# Patient Record
Sex: Male | Born: 1985 | Race: White | Hispanic: No | Marital: Married
Health system: Midwestern US, Academic
[De-identification: ages and names within clinical notes are randomized; demographics above are authoritative.]

---

## 2020-08-26 ENCOUNTER — Encounter: Admit: 2020-08-26 | Discharge: 2020-08-26

## 2020-12-29 ENCOUNTER — Encounter: Admit: 2020-12-29 | Discharge: 2020-12-29 | Payer: Commercial Managed Care - PPO

## 2021-01-02 ENCOUNTER — Encounter: Admit: 2021-01-02 | Discharge: 2021-01-02 | Payer: Commercial Managed Care - PPO

## 2021-01-02 DIAGNOSIS — F419 Anxiety disorder, unspecified: Secondary | ICD-10-CM

## 2021-01-02 DIAGNOSIS — K219 Gastro-esophageal reflux disease without esophagitis: Secondary | ICD-10-CM

## 2021-01-02 DIAGNOSIS — I10 Essential (primary) hypertension: Secondary | ICD-10-CM

## 2021-01-02 DIAGNOSIS — M109 Gout, unspecified: Secondary | ICD-10-CM

## 2021-01-04 ENCOUNTER — Encounter: Admit: 2021-01-04 | Discharge: 2021-01-04 | Payer: Commercial Managed Care - PPO

## 2021-01-05 ENCOUNTER — Encounter: Admit: 2021-01-05 | Discharge: 2021-01-05 | Payer: Commercial Managed Care - PPO

## 2021-01-05 DIAGNOSIS — F419 Anxiety disorder, unspecified: Secondary | ICD-10-CM

## 2021-01-05 DIAGNOSIS — I10 Essential (primary) hypertension: Secondary | ICD-10-CM

## 2021-01-05 DIAGNOSIS — E6609 Other obesity due to excess calories: Secondary | ICD-10-CM

## 2021-01-05 DIAGNOSIS — Z8249 Family history of ischemic heart disease and other diseases of the circulatory system: Secondary | ICD-10-CM

## 2021-01-05 DIAGNOSIS — E785 Hyperlipidemia, unspecified: Secondary | ICD-10-CM

## 2021-01-05 DIAGNOSIS — K219 Gastro-esophageal reflux disease without esophagitis: Secondary | ICD-10-CM

## 2021-01-05 DIAGNOSIS — M109 Gout, unspecified: Secondary | ICD-10-CM

## 2021-01-05 DIAGNOSIS — Z23 Encounter for immunization: Secondary | ICD-10-CM

## 2021-01-05 DIAGNOSIS — R931 Abnormal findings on diagnostic imaging of heart and coronary circulation: Secondary | ICD-10-CM

## 2021-01-05 DIAGNOSIS — Z136 Encounter for screening for cardiovascular disorders: Secondary | ICD-10-CM

## 2021-01-05 NOTE — Progress Notes
Date of Service: 01/05/2021    Arthur Richard is a 35 y.o. male.       HPI      Arthur Richard is a 35 y.o. white  male with 2-3 years history of hypertension, obesity, current BMI 39.62 kg/m?, hyperlipidemia, family history of coronary artery disease, history of COVID-19 viral syndrome upper respiratory tract infection, and a recently performed calcium score dated 12/20/2020 it was 21, all of it located in the LAD.    Patient does not report having any symptoms of chest pain, no shortness of breath, no heart palpitations, no presyncope or syncope.    He has anxiety and he is very worried about the findings on the cardioscan.    He was evaluated for hyperlipidemia on 12/10/2020 and this demonstrated the following TC = 296 mg/dL, triglycerides = 454 mg/dL, HDL = 41 mg/dL, LDL = 098 mg/dL.  Patient was initiated on atorvastatin.    Past medical history: Hypertension, gout.    Family history: Patient's father did undergo CABG, mother has hyperlipidemia, has a brother with NASH.     Social history: Patient works as a Furniture conservator/restorer at [employer].  He does not smoke cigarettes and drinks alcohol occasionally         Vitals:    01/05/21 1317   BP: (!) 128/90   BP Source: Arm, Left Upper   Pulse: 98   SpO2: 98%   O2 Device: None (Room air)   PainSc: Zero   Weight: (!) 140 kg (308 lb 9.6 oz)   Height: 188 cm (6' 2)     Body mass index is 39.62 kg/m?Marland Kitchen     Past Medical History  Patient Active Problem List    Diagnosis Date Noted   ? Anxiety 01/02/2021   ? GERD (gastroesophageal reflux disease) 01/02/2021   ? Primary hypertension 01/02/2021   ? Gout 01/02/2021   ? Elevated coronary artery calcium score 01/02/2021     12/20/20 Coronary Calcium Score:  21  LAD            Review of Systems   Constitutional: Negative.   HENT: Negative.    Eyes: Negative.    Cardiovascular: Negative.    Respiratory: Negative.    Endocrine: Negative.    Hematologic/Lymphatic: Negative.    Skin: Negative.    Musculoskeletal: Negative. Gastrointestinal: Negative.    Genitourinary: Negative.    Neurological: Negative.    Psychiatric/Behavioral: Negative.    Allergic/Immunologic: Negative.        Physical Exam  General Appearance: obese  Skin: warm, moist, no ulcers or xanthomas  Eyes: conjunctivae and lids normal, pupils are equal and round  Lips & Oral Mucosa: no pallor or cyanosis  Neck Veins: neck veins are flat, neck veins are not distended  Chest Inspection: chest is normal in appearance  Respiratory Effort: breathing comfortably, no respiratory distress  Auscultation/Percussion: lungs clear to auscultation, no rales or rhonchi, no wheezing  Cardiac Rhythm: regular rhythm and normal rate  Cardiac Auscultation: S1, S2 normal, no rub, no gallop  Murmurs: no murmur  Carotid Arteries: normal carotid upstroke bilaterally, no bruit  Abdominal aorta: could not be examined due to obese adomen  Lower Extremity Edema: no lower extremity edema  Abdominal Exam: soft, non-tender, no masses, bowel sounds normal  Liver & Spleen: no organomegaly  Language and Memory: patient responsive and seems to comprehend information  Neurologic Exam: neurological assessment grossly intact      Cardiovascular Studies  Twelve-lead  EKG demonstrated normal sinus rhythm, no axis deviation, ventricular rate 90 bpm    Cardiovascular Health Factors  Vitals BP Readings from Last 3 Encounters:   01/05/21 (!) 128/90     Wt Readings from Last 3 Encounters:   01/05/21 (!) 140 kg (308 lb 9.6 oz)   04/22/09 131.1 kg (289 lb)     BMI Readings from Last 3 Encounters:   01/05/21 39.62 kg/m?   04/22/09 38.13 kg/m?      Smoking Social History     Tobacco Use   Smoking Status Never Smoker   Smokeless Tobacco Never Used      Lipid Profile No results found for: CHOL  No results found for: HDL  No results found for: LDL  No results found for: TRIG   Blood Sugar No results found for: HGBA1C  No results found for: GLU, GLUF, GLUPOC       Problems Addressed Today  Encounter Diagnoses   Name Primary?   ? Screening for heart disease Yes   ? COVID-19 vaccine administered    ? Elevated coronary artery calcium score    ? Primary hypertension    ? Hyperlipidemia, unspecified hyperlipidemia type    ? Class 2 obesity due to excess calories without serious comorbidity with body mass index (BMI) of 39.0 to 39.9 in adult    ? Anxiety    ? Gastroesophageal reflux disease, unspecified whether esophagitis present    ? Family history of coronary artery disease        Assessment and Plan     Impression:    1.  Abnormal calcium score- it was 21, all located in the LAD, study dated 12/20/2020  2.  Primary hypertension-currently on an ACE inhibitor  3.  Hyperlipidemia-significantly elevated total cholesterol and LDL, patient was initiated on atorvastatin  4.  Family history of coronary artery disease-patient's father did undergo CABG  5.  Obesity-patient's BMI is 39.62 kg/m?  6.  At risk for further progression of coronary artery disease based on the current rescore, family history, hyperlipidemia, hypertension, obesity and gender  7.  History of heart palpitations in the past-patient was evaluated with a Holter monitor, unremarkable.    Plan:    1.  Patient will be evaluated with the following:  ? Perfusion imaging study Bruce protocol  ? 2D echo Doppler study  2.  I highly recommended weight reduction, 10% of present weight over the next 6 months  ? I do recommend DASHdiet program  ? Also recommended weight watchers  3.  I recommended overall risk factors modifications, low-fat, low-cholesterol, low sugar, low carbohydrate and a strict low-sodium diet  4.  Repeat fasting lipid and liver profile in 3 months  5.  We will call the patient with the results of the echocardiogram and stress test and will continue to follow him up in the office      Total Time Today was 60 minutes in the following activities: Preparing to see the patient, Obtaining and/or reviewing separately obtained history, Performing a medically appropriate examination and/or evaluation, Counseling and educating the patient/family/caregiver, Ordering medications, tests, or procedures, Referring and communication with other health care professionals (when not separately reported), Documenting clinical information in the electronic or other health record, Independently interpreting results (not separately reported) and communicating results to the patient/family/caregiver and Care coordination (not separately reported)         Current Medications (including today's revisions)  ? allopurinoL (ZYLOPRIM) 300 mg tablet Take 300 mg by mouth  daily.   ? atorvastatin (LIPITOR) 20 mg tablet Take 20 mg by mouth daily.   ? clonazepam (KLONOPIN) 0.5 mg PO tablet Take 0.5 mg by mouth As Needed.   ? lisinopriL (ZESTRIL) 20 mg tablet Take 20 mg by mouth daily.   ? venlafaxine XR (EFFEXOR XR) 150 mg capsule Take 150 mg by mouth daily.

## 2021-06-12 ENCOUNTER — Encounter: Admit: 2021-06-12 | Discharge: 2021-06-12 | Payer: Commercial Managed Care - PPO

## 2021-06-26 ENCOUNTER — Encounter: Admit: 2021-06-26 | Discharge: 2021-06-26 | Payer: Commercial Managed Care - PPO

## 2021-06-26 NOTE — Telephone Encounter
Patient called nursing line stating that he doesn't want to complete stress test and echo at this time. He states that the testing is very expensive and he has been working on on dieting and exercise. Patient states that he would like to keep his follow up appointment with Dr. Avie ArenasHannen 08/01/21 to further discuss care plan. Patient verified D-T-L of appointment.

## 2021-07-26 ENCOUNTER — Encounter: Admit: 2021-07-26 | Discharge: 2021-07-26 | Payer: Commercial Managed Care - PPO

## 2021-07-26 DIAGNOSIS — E785 Hyperlipidemia, unspecified: Secondary | ICD-10-CM

## 2021-07-26 LAB — LIPID PROFILE
CHOLESTEROL/HDL %: 4
CHOLESTEROL: 165
HDL: 41
LDL: 95
TRIGLYCERIDES: 146
VLDL: 29

## 2021-08-01 ENCOUNTER — Encounter: Admit: 2021-08-01 | Discharge: 2021-08-01 | Payer: Commercial Managed Care - PPO

## 2021-08-01 ENCOUNTER — Ambulatory Visit: Admit: 2021-08-01 | Discharge: 2021-08-02 | Payer: Commercial Managed Care - PPO

## 2021-08-01 DIAGNOSIS — E6609 Other obesity due to excess calories: Secondary | ICD-10-CM

## 2021-08-01 DIAGNOSIS — I10 Essential (primary) hypertension: Secondary | ICD-10-CM

## 2021-08-01 DIAGNOSIS — K219 Gastro-esophageal reflux disease without esophagitis: Secondary | ICD-10-CM

## 2021-08-01 DIAGNOSIS — Z8249 Family history of ischemic heart disease and other diseases of the circulatory system: Secondary | ICD-10-CM

## 2021-08-01 DIAGNOSIS — M109 Gout, unspecified: Secondary | ICD-10-CM

## 2021-08-01 DIAGNOSIS — R931 Abnormal findings on diagnostic imaging of heart and coronary circulation: Secondary | ICD-10-CM

## 2021-08-01 DIAGNOSIS — F419 Anxiety disorder, unspecified: Secondary | ICD-10-CM

## 2021-08-01 DIAGNOSIS — Z23 Encounter for immunization: Secondary | ICD-10-CM

## 2021-08-01 NOTE — Progress Notes
Date of Service: 08/01/2021    Arthur Richard is a 36 y.o. male.       HPI     Arthur Richard is a 36 y.o.  white male with a history of hypertension, obesity, hyperlipidemia, family history of coronary artery disease, history of COVID-19 viral syndrome upper respiratory tract infection, mildly abnormal calcium score performed in September 2022, it was 21 located in the LAD.    Patient was initiated on statin therapy, the most recent cholesterol profile dated 07/26/2021 demonstrated the following: TC = 165 mg/dL, (previously 147 mg/dL,, TG = 829 mg/dL, (previously 562 mg/dL), HDL remains stable at 41 mg/dL, the LDL decreased from 217 mg/dL to 95 mg/dL, the previous cholesterol profile was performed on 12/10/2020.    Patient does have any symptoms of chest pain or heart palpitations.  He also has managed to lose weight, at the previous office visit on 01/03/2021 he weighed 308 pounds and currently he weighs 290 pounds.         Vitals:    08/01/21 1513   BP: 138/88   BP Source: Arm, Left Upper   Pulse: 87   SpO2: 98%   O2 Device: None (Room air)   PainSc: Zero   Weight: 131.7 kg (290 lb 6.4 oz)   Height: 188 cm (6' 2)     Body mass index is 37.29 kg/m?Marland Kitchen     Past Medical History  Patient Active Problem List    Diagnosis Date Noted   ? COVID-19 vaccine administered 01/05/2021   ? Class 2 obesity due to excess calories without serious comorbidity with body mass index (BMI) of 39.0 to 39.9 in adult 01/05/2021   ? Family history of coronary artery disease 01/05/2021   ? Anxiety 01/02/2021   ? GERD (gastroesophageal reflux disease) 01/02/2021   ? Primary hypertension 01/02/2021   ? Gout 01/02/2021   ? Elevated coronary artery calcium score 01/02/2021     12/20/20 Coronary Calcium Score:  21  LAD            Review of Systems   Constitutional: Negative.   HENT: Negative.    Eyes: Negative.    Cardiovascular: Negative.    Respiratory: Negative.    Endocrine: Negative.    Hematologic/Lymphatic: Negative.    Skin: Negative. Musculoskeletal: Negative.    Gastrointestinal: Negative.    Genitourinary: Negative.    Neurological: Negative.    Psychiatric/Behavioral: Negative.    Allergic/Immunologic: Negative.        Physical Exam  General Appearance: obese  Skin: warm, moist, no ulcers or xanthomas  Eyes: conjunctivae and lids normal, pupils are equal and round  Lips & Oral Mucosa: no pallor or cyanosis  Neck Veins: neck veins are flat, neck veins are not distended  Chest Inspection: chest is normal in appearance  Respiratory Effort: breathing comfortably, no respiratory distress  Auscultation/Percussion: lungs clear to auscultation, no rales or rhonchi, no wheezing  Cardiac Rhythm: regular rhythm and normal rate  Cardiac Auscultation: S1, S2 normal, no rub, no gallop  Murmurs: no murmur  Carotid Arteries: normal carotid upstroke bilaterally, no bruit  Abdominal aorta: could not be examined due to obese adomen  Lower Extremity Edema: no lower extremity edema  Abdominal Exam: soft, non-tender, no masses, bowel sounds normal  Liver & Spleen: no organomegaly  Language and Memory: patient responsive and seems to comprehend information  Neurologic Exam: neurological assessment grossly intact        Cardiovascular Studies  Cardiovascular Health Factors  Vitals BP Readings from Last 3 Encounters:   08/01/21 138/88   01/05/21 (!) 128/90     Wt Readings from Last 3 Encounters:   08/01/21 131.7 kg (290 lb 6.4 oz)   01/05/21 (!) 140 kg (308 lb 9.6 oz)   04/22/09 131.1 kg (289 lb)     BMI Readings from Last 3 Encounters:   08/01/21 37.29 kg/m?   01/05/21 39.62 kg/m?   04/22/09 38.13 kg/m?      Smoking Social History     Tobacco Use   Smoking Status Never   Smokeless Tobacco Never      Lipid Profile Cholesterol   Date Value Ref Range Status   07/26/2021 165  Final     HDL   Date Value Ref Range Status   07/26/2021 41  Final     LDL   Date Value Ref Range Status   07/26/2021 95  Final     Triglycerides   Date Value Ref Range Status   07/26/2021 146  Final      Blood Sugar No results found for: HGBA1C  Glucose   Date Value Ref Range Status   12/10/2020 96  Final          Problems Addressed Today  Encounter Diagnoses   Name Primary?   ? Elevated coronary artery calcium score Yes   ? Primary hypertension    ? Anxiety    ? Class 2 obesity due to excess calories without serious comorbidity with body mass index (BMI) of 39.0 to 39.9 in adult    ? COVID-19 vaccine administered    ? Family history of coronary artery disease    ? Gastroesophageal reflux disease, unspecified whether esophagitis present        Assessment and Plan     Assessment:    1.  Hypertension-she continues on lisinopril, his blood pressure is under good control  2.  Hyperlipidemia-on atorvastatin, significantly improved cholesterol profile  3.  Obesity-BMI 37.29 kg/m?, patient did lose weight from the previous office visit  4.  Family history of CAD-patient's father did undergo CABG  5.  History of COVID-19 viral syndrome-recovered  6.  Mildly abnormal coronary calcium score performed in September 2022-it was 21 and mainly located in the LAD    Plan:    1.  Continue all current medications  2.  I did suggest for the patient to return to your office and discuss whether one of the SGLT2 inhibitors would be indicated to help with further weight loss  3.  Patient will be evaluated with an echocardiogram-we will follow-up on the results and call with further recommendations  4.  Follow-up office visit in 6 months    Total Time Today was 40 minutes in the following activities: Preparing to see the patient, Obtaining and/or reviewing separately obtained history, Performing a medically appropriate examination and/or evaluation, Counseling and educating the patient/family/caregiver, Ordering medications, tests, or procedures, Referring and communication with other health care professionals (when not separately reported), Documenting clinical information in the electronic or other health record, Independently interpreting results (not separately reported) and communicating results to the patient/family/caregiver and Care coordination (not separately reported)         Current Medications (including today's revisions)  ? allopurinoL (ZYLOPRIM) 300 mg tablet Take one tablet by mouth daily.   ? atorvastatin (LIPITOR) 20 mg tablet Take one tablet by mouth daily.   ? clonazepam (KLONOPIN) 0.5 mg PO tablet Take one tablet by mouth  as Needed.   ? lisinopriL (ZESTRIL) 20 mg tablet Take one tablet by mouth daily.   ? venlafaxine XR (EFFEXOR XR) 150 mg capsule Take one capsule by mouth daily.

## 2022-08-22 IMAGING — CR [ID]
2 series · 2 of 2 positions shown · non-contrast
Comparison: none

[hand pa]
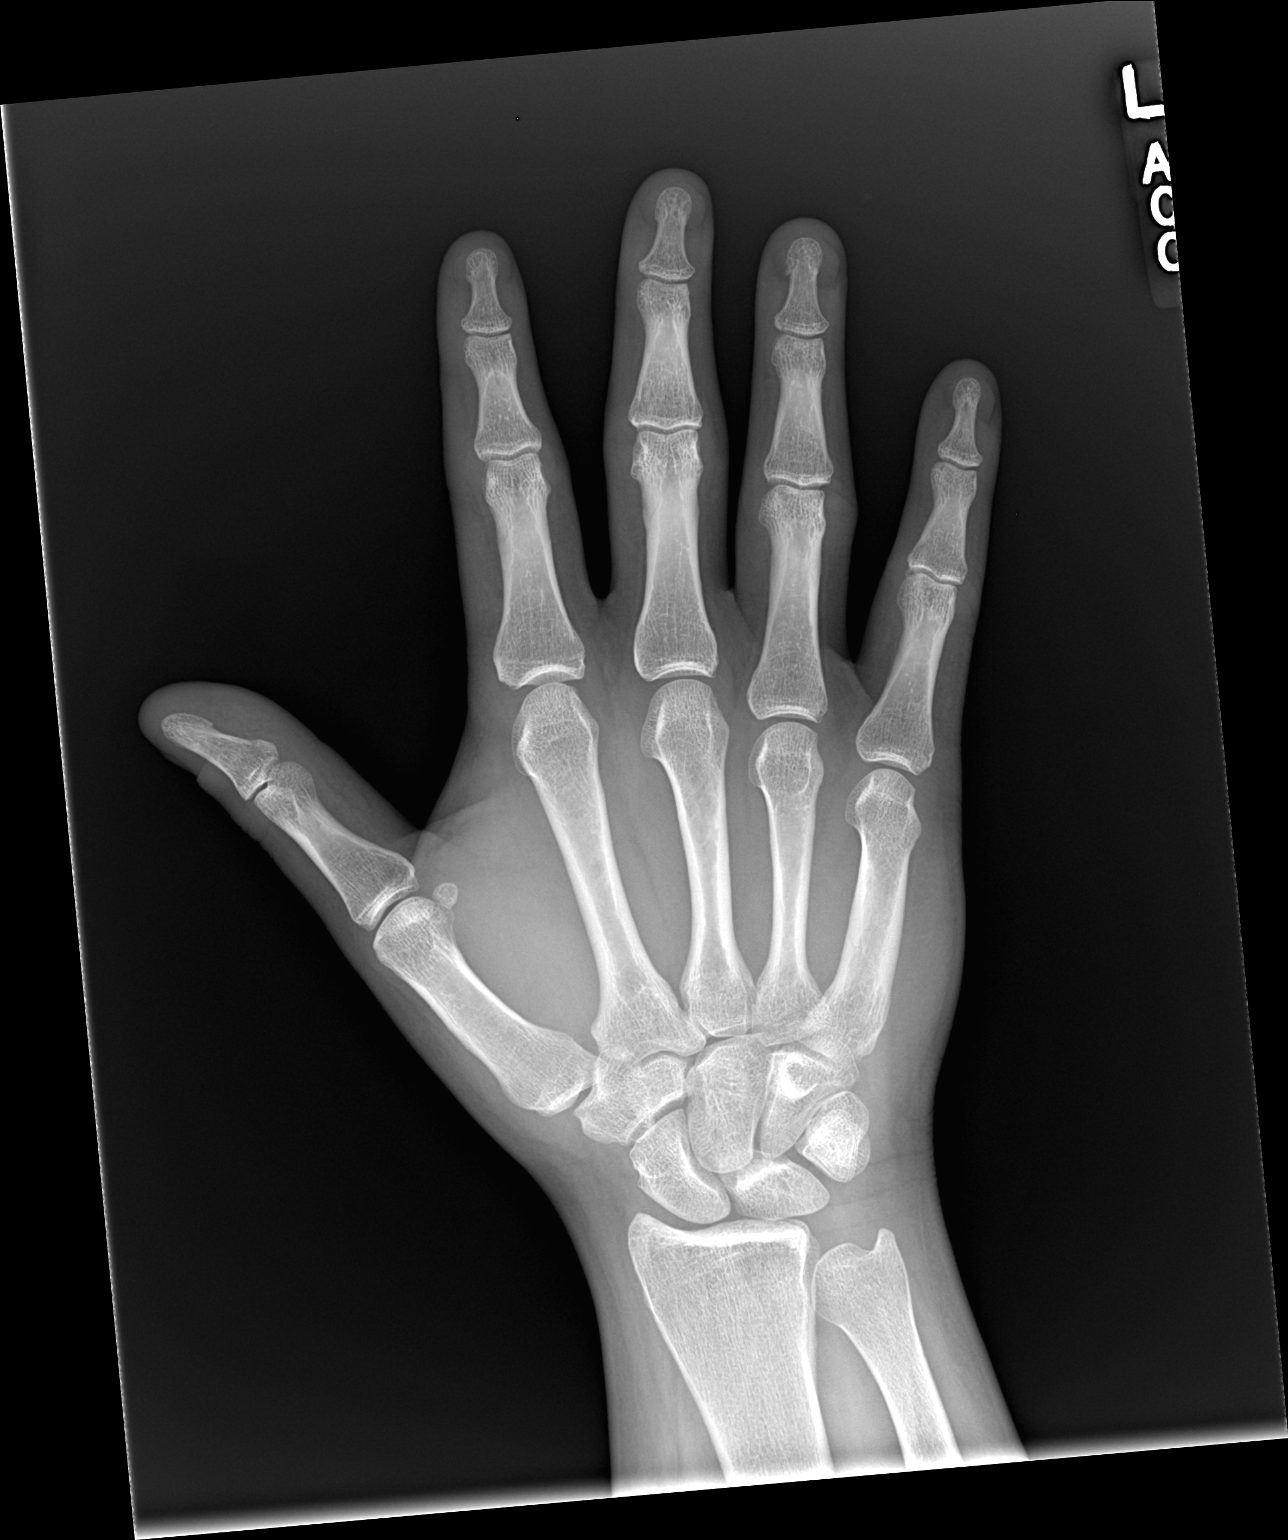

[hand lat]
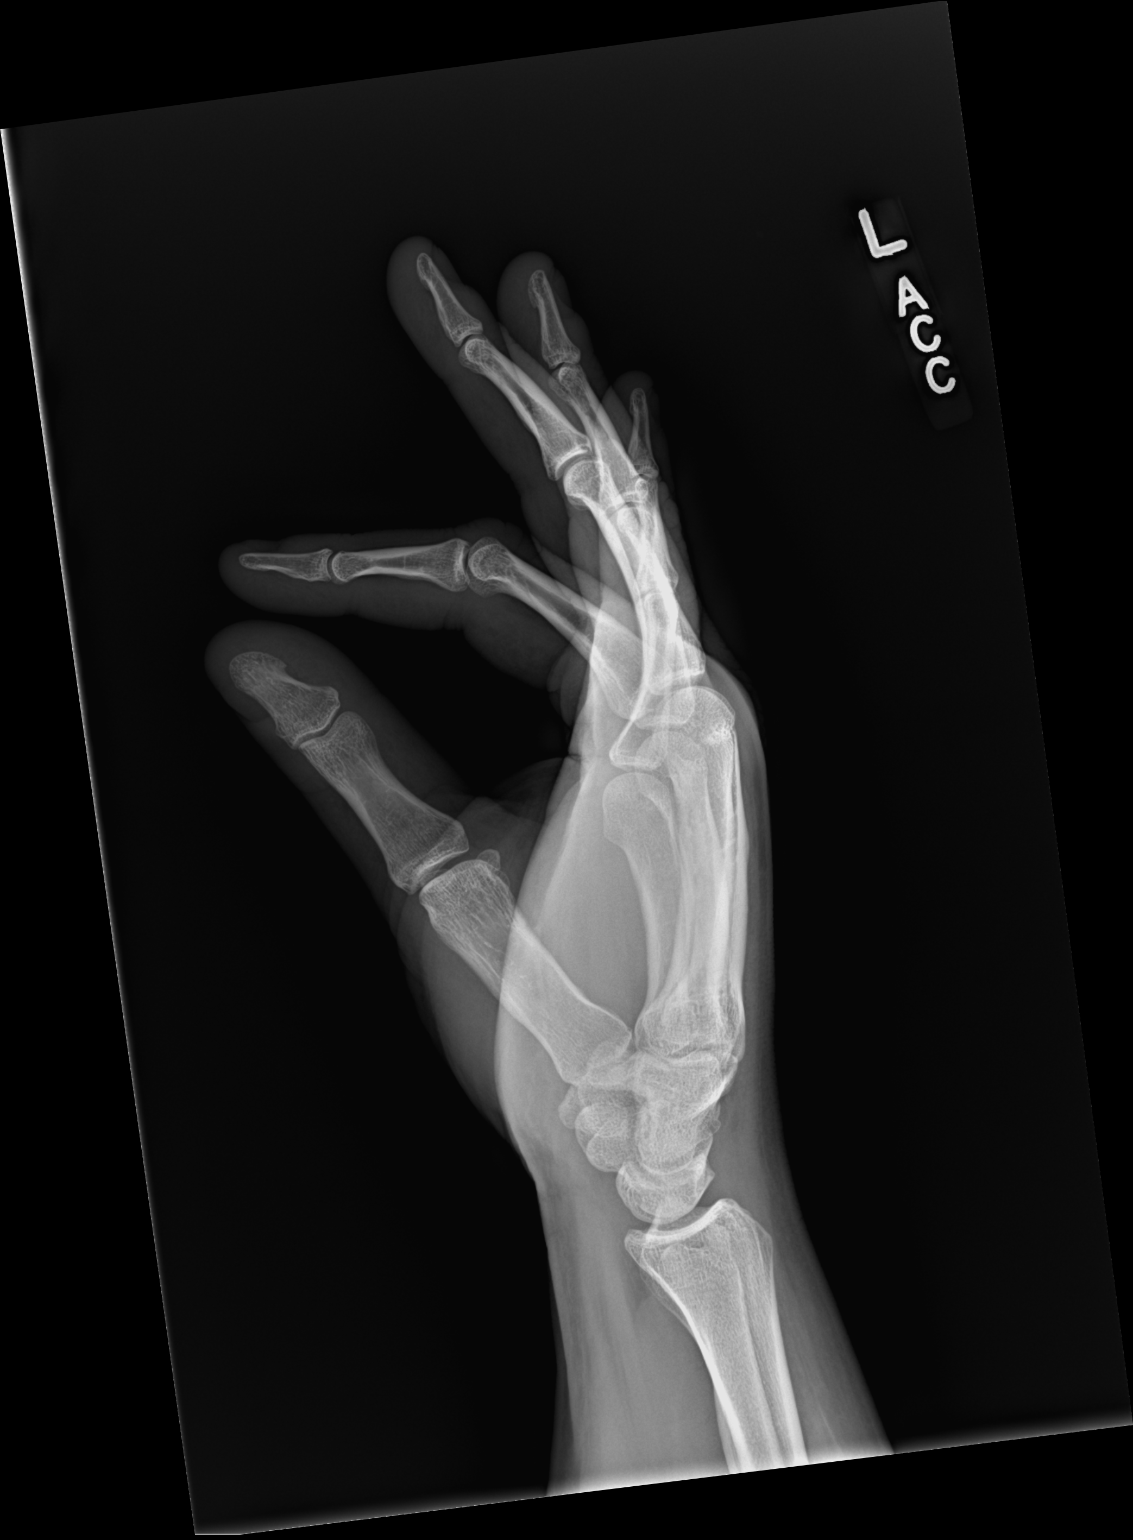

[2 of 2 positions shown; findings below may reference images not displayed]

EXAM

XR hand RT min 3V

INDICATION

rt thumb injury
Right thumb pain after slamming hand in car door yesterday.

TECHNIQUE

3 views of the right hand

COMPARISONS

None available at the time of dictation.

FINDINGS

No radiographic evidence of an acute fracture, osseous malalignment, or aggressive focal osseous
lesion. There is anatomic joint alignment. Mild negative ulnar variance.

IMPRESSION
1. No radiographic evidence of an acute osseous abnormality.

Tech Notes:

Right thumb pain after slamming hand in car door yesterday.
